# Patient Record
Sex: Female | Born: 1995
Health system: Southern US, Community
[De-identification: ages and names within clinical notes are randomized; demographics above are authoritative.]

## PROBLEM LIST (undated history)

## (undated) DIAGNOSIS — D573 Sickle-cell trait: Secondary | ICD-10-CM

## (undated) HISTORY — PX: ANTERIOR CRUCIATE LIGAMENT REPAIR: SHX115

---
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
## 2014-10-08 ENCOUNTER — Emergency Department (HOSPITAL_BASED_OUTPATIENT_CLINIC_OR_DEPARTMENT_OTHER)
Admission: EM | Admit: 2014-10-08 | Discharge: 2014-10-08 | Disposition: A | Discharge status: Home / Self Care | Payer: BLUE CROSS/BLUE SHIELD | Attending: Emergency Medicine | Admitting: Emergency Medicine

## 2014-10-08 ENCOUNTER — Encounter (HOSPITAL_BASED_OUTPATIENT_CLINIC_OR_DEPARTMENT_OTHER): Payer: Self-pay | Admitting: *Deleted

## 2014-10-08 ENCOUNTER — Emergency Department (HOSPITAL_BASED_OUTPATIENT_CLINIC_OR_DEPARTMENT_OTHER): Payer: BLUE CROSS/BLUE SHIELD

## 2014-10-08 DIAGNOSIS — Y998 Other external cause status: Secondary | ICD-10-CM | POA: Insufficient documentation

## 2014-10-08 DIAGNOSIS — S8991XA Unspecified injury of right lower leg, initial encounter: Secondary | ICD-10-CM | POA: Insufficient documentation

## 2014-10-08 DIAGNOSIS — W010XXA Fall on same level from slipping, tripping and stumbling without subsequent striking against object, initial encounter: Secondary | ICD-10-CM | POA: Diagnosis not present

## 2014-10-08 DIAGNOSIS — Y9289 Other specified places as the place of occurrence of the external cause: Secondary | ICD-10-CM | POA: Insufficient documentation

## 2014-10-08 DIAGNOSIS — Z862 Personal history of diseases of the blood and blood-forming organs and certain disorders involving the immune mechanism: Secondary | ICD-10-CM | POA: Insufficient documentation

## 2014-10-08 DIAGNOSIS — Y9389 Activity, other specified: Secondary | ICD-10-CM | POA: Insufficient documentation

## 2014-10-08 DIAGNOSIS — M25561 Pain in right knee: Secondary | ICD-10-CM

## 2014-10-08 HISTORY — DX: Sickle-cell trait: D57.3

## 2014-10-08 MED ORDER — ACETAMINOPHEN 325 MG PO TABS
650.0000 mg | ORAL_TABLET | Freq: Once | ORAL | Status: AC
  Administered 2014-10-08: 650 mg via ORAL
  Filled 2014-10-08: qty 2

## 2014-10-08 MED ORDER — NAPROXEN 250 MG PO TABS: 250.0000 mg | ORAL_TABLET | Freq: Two times a day (BID) | ORAL | Status: DC

## 2014-10-08 NOTE — ED Notes (Signed)
Pt c/o right knee injury x 4 hrs ago

## 2014-10-08 NOTE — ED Notes (Signed)
Pt returned from xr

## 2014-10-08 NOTE — ED Provider Notes (Signed)
CSN: [id number]     Arrival date & time 10/08/14  1849 History   First MD Initiated Contact with Patient 10/08/14 1912     Chief Complaint  Patient presents with  . Knee Injury   Jacqueline Coffey is 19 y.o. female with a history of a previous right anterior cruciate ligament repair who presents to the emergency department complaining of right anterior knee pain after she tripped and fell while outside today. The patient reports she tripped and fell and injured her knee and an unknown way. She does not believe she fell on her knee. She is complaining of 1 out of 10 anterior knee pain becomes a 7 out of 10 with movement. She reports being able to ambulate without difficulty. Patient is taking nothing for treatment today. She denies numbness, tingling, weakness, ankle pain, foot pain, calf pain or leg swelling.  (Consider location/radiation/quality/duration/timing/severity/associated sxs/prior Treatment) HPI  Past Medical History  Diagnosis Date  . Sickle-cell trait    Past Surgical History  Procedure Laterality Date  . Anterior cruciate ligament repair     History reviewed. No pertinent family history. History  Substance Use Topics  . Smoking status: Never Smoker   . Smokeless tobacco: Not on file  . Alcohol Use: No   OB History    No data available     Review of Systems  Constitutional: Negative for fever.  Musculoskeletal: Positive for arthralgias. Negative for back pain and joint swelling.       Right knee pain   Skin: Negative for rash and wound.  Neurological: Negative for weakness and numbness.      Allergies  Review of patient's allergies indicates no known allergies.  Home Medications   Prior to Admission medications   Medication Sig Start Date End Date Taking? Authorizing Provider  naproxen (NAPROSYN) 250 MG tablet Take 1 tablet (250 mg total) by mouth 2 (two) times daily with a meal. 10/08/14   Everlene FarrierWilliam Clayborne Divis, PA-C   BP 147/88 mmHg  Pulse 92  Temp(Src) 98.6 F  (37 C) (Oral)  Resp 16  Ht 5\' 8"  (1.727 m)  Wt 210 lb (95.255 kg)  BMI 31.94 kg/m2  SpO2 98%  LMP 09/08/2014 Physical Exam  Constitutional: She appears well-developed and well-nourished. No distress.  Nontoxic-appearing.  HENT:  Head: Normocephalic and atraumatic.  Eyes: Right eye exhibits no discharge. Left eye exhibits no discharge.  Cardiovascular: Intact distal pulses.   Bilateral radial, posterior tibialis and dorsalis pedis pulses are intact.    Pulmonary/Chest: Effort normal. No respiratory distress.  Musculoskeletal: Normal range of motion. She exhibits no edema or tenderness.  No right knee tenderness to palpation. No right knee instability. No right knee edema, deformity, abrasions, or ecchymosis. There is a linear scar to her right knee from her previous anterior cruciate ligament repair. The patient is able to ambulate without difficulty or assistance. No right ankle or foot tenderness.  Neurological: She is alert. Coordination normal.  Skin: Skin is warm and dry. No rash noted. She is not diaphoretic. No erythema. No pallor.  Psychiatric: She has a normal mood and affect. Her behavior is normal.  Nursing note and vitals reviewed.   ED Course  Procedures (including critical care time) Labs Review Labs Reviewed - No data to display  Imaging Review Dg Knee Complete 4 Views Right  10/08/2014   CLINICAL DATA:  Post fall today now with diffuse right knee pain. History of ACL repair.  EXAM: RIGHT KNEE - COMPLETE 4+ VIEW  COMPARISON:  Right knee MRI - 08/04/2009 ; right knee radiographs - 08/01/2009  FINDINGS: No fracture or dislocation. Post ACL repair. Joint spaces are preserved. No evidence of chondrocalcinosis. No joint effusion. An involuting NOF is again noted within the distal tibial metaphysis without associated periosteal reaction, grossly unchanged since remote knee MRI and radiographs performed in 2011. Regional soft tissues appear normal.  IMPRESSION: 1. No acute  findings. 2. Sequela of prior ACL repair.   Electronically Signed   By: Simonne Come M.D.   On: 10/08/2014 21:19     EKG Interpretation None      Filed Vitals:   10/08/14 1853  BP: 147/88  Pulse: 92  Temp: 98.6 F (37 C)  TempSrc: Oral  Resp: 16  Height:  (1.727 m)  Weight: 210 lb (95.255 kg)  SpO2: 98%     MDM   Meds given in ED:  Medications  acetaminophen (TYLENOL) tablet 650 mg (650 mg Oral Given 10/08/14 1941)    New Prescriptions   NAPROXEN (NAPROSYN) 250 MG TABLET    Take 1 tablet (250 mg total) by mouth 2 (two) times daily with a meal.    Final diagnoses:  Right knee pain   This is a 19 year old female with a history of a right anterior cruciate ligament repair presents to emergency department complaining of right knee pain after trip and fall earlier today. She is complaining of anterior knee pain. She is unsure of exactly how she injured her right knee. On my exam I am unable to elicit any pain to her knee. Knee x-ray is unremarkable. Will give patient knee sleeve and have her follow-up with her orthopedic surgeon and primary care provider. I advised the patient to follow-up with their primary care provider this week. I advised the patient to return to the emergency department with new or worsening symptoms or new concerns. The patient verbalized understanding and agreement with plan.      Everlene Farrier, PA-C 10/08/14 2136  Geoffery Lyons, MD 10/09/14 1440

## 2014-10-08 NOTE — Discharge Instructions (Signed)
Knee Pain °The knee is the complex joint between your thigh and your lower leg. It is made up of bones, tendons, ligaments, and cartilage. The bones that make up the knee are: °· The femur in the thigh. °· The tibia and fibula in the lower leg. °· The patella or kneecap riding in the groove on the lower femur. °CAUSES  °Knee pain is a common complaint with many causes. A few of these causes are: °· Injury, such as: °¨ A ruptured ligament or tendon injury. °¨ Torn cartilage. °· Medical conditions, such as: °¨ Gout °¨ Arthritis °¨ Infections °· Overuse, over training, or overdoing a physical activity. °Knee pain can be minor or severe. Knee pain can accompany debilitating injury. Minor knee problems often respond well to self-care measures or get well on their own. More serious injuries may need medical intervention or even surgery. °SYMPTOMS °The knee is complex. Symptoms of knee problems can vary widely. Some of the problems are: °· Pain with movement and weight bearing. °· Swelling and tenderness. °· Buckling of the knee. °· Inability to straighten or extend your knee. °· Your knee locks and you cannot straighten it. °· Warmth and redness with pain and fever. °· Deformity or dislocation of the kneecap. °DIAGNOSIS  °Determining what is wrong may be very straight forward such as when there is an injury. It can also be challenging because of the complexity of the knee. Tests to make a diagnosis may include: °· Your caregiver taking a history and doing a physical exam. °· Routine X-rays can be used to rule out other problems. X-rays will not reveal a cartilage tear. Some injuries of the knee can be diagnosed by: °· Arthroscopy a surgical technique by which a small video camera is inserted through tiny incisions on the sides of the knee. This procedure is used to examine and repair internal knee joint problems. Tiny instruments can be used during arthroscopy to repair the torn knee cartilage (meniscus). °· Arthrography  is a radiology technique. A contrast liquid is directly injected into the knee joint. Internal structures of the knee joint then become visible on X-ray film. °· An MRI scan is a non X-ray radiology procedure in which magnetic fields and a computer produce two- or three-dimensional images of the inside of the knee. Cartilage tears are often visible using an MRI scanner. MRI scans have largely replaced arthrography in diagnosing cartilage tears of the knee. °· Blood work. °· Examination of the fluid that helps to lubricate the knee joint (synovial fluid). This is done by taking a sample out using a needle and a syringe. °TREATMENT °The treatment of knee problems depends on the cause. Some of these treatments are: °· Depending on the injury, proper casting, splinting, surgery, or physical therapy care will be needed. °· Give yourself adequate recovery time. Do not overuse your joints. If you begin to get sore during workout routines, back off. Slow down or do fewer repetitions. °· For repetitive activities such as cycling or running, maintain your strength and nutrition. °· Alternate muscle groups. For example, if you are a weight lifter, work the upper body on one day and the lower body the next. °· Either tight or weak muscles do not give the proper support for your knee. Tight or weak muscles do not absorb the stress placed on the knee joint. Keep the muscles surrounding the knee strong. °· Take care of mechanical problems. °· If you have flat feet, orthotics or special shoes may help.   See your caregiver if you need help. °· Arch supports, sometimes with wedges on the inner or outer aspect of the heel, can help. These can shift pressure away from the side of the knee most bothered by osteoarthritis. °· A brace called an "unloader" brace also may be used to help ease the pressure on the most arthritic side of the knee. °· If your caregiver has prescribed crutches, braces, wraps or ice, use as directed. The acronym  for this is PRICE. This means protection, rest, ice, compression, and elevation. °· Nonsteroidal anti-inflammatory drugs (NSAIDs), can help relieve pain. But if taken immediately after an injury, they may actually increase swelling. Take NSAIDs with food in your stomach. Stop them if you develop stomach problems. Do not take these if you have a history of ulcers, stomach pain, or bleeding from the bowel. Do not take without your caregiver's approval if you have problems with fluid retention, heart failure, or kidney problems. °· For ongoing knee problems, physical therapy may be helpful. °· Glucosamine and chondroitin are over-the-counter dietary supplements. Both may help relieve the pain of osteoarthritis in the knee. These medicines are different from the usual anti-inflammatory drugs. Glucosamine may decrease the rate of cartilage destruction. °· Injections of a corticosteroid drug into your knee joint may help reduce the symptoms of an arthritis flare-up. They may provide pain relief that lasts a few months. You may have to wait a few months between injections. The injections do have a small increased risk of infection, water retention, and elevated blood sugar levels. °· Hyaluronic acid injected into damaged joints may ease pain and provide lubrication. These injections may work by reducing inflammation. A series of shots may give relief for as long as 6 months. °· Topical painkillers. Applying certain ointments to your skin may help relieve the pain and stiffness of osteoarthritis. Ask your pharmacist for suggestions. Many over the-counter products are approved for temporary relief of arthritis pain. °· In some countries, doctors often prescribe topical NSAIDs for relief of chronic conditions such as arthritis and tendinitis. A review of treatment with NSAID creams found that they worked as well as oral medications but without the serious side effects. °PREVENTION °· Maintain a healthy weight. Extra pounds  put more strain on your joints. °· Get strong, stay limber. Weak muscles are a common cause of knee injuries. Stretching is important. Include flexibility exercises in your workouts. °· Be smart about exercise. If you have osteoarthritis, chronic knee pain or recurring injuries, you may need to change the way you exercise. This does not mean you have to stop being active. If your knees ache after jogging or playing basketball, consider switching to swimming, water aerobics, or other low-impact activities, at least for a few days a week. Sometimes limiting high-impact activities will provide relief. °· Make sure your shoes fit well. Choose footwear that is right for your sport. °· Protect your knees. Use the proper gear for knee-sensitive activities. Use kneepads when playing volleyball or laying carpet. Buckle your seat belt every time you drive. Most shattered kneecaps occur in car accidents. °· Rest when you are tired. °SEEK MEDICAL CARE IF:  °You have knee pain that is continual and does not seem to be getting better.  °SEEK IMMEDIATE MEDICAL CARE IF:  °Your knee joint feels hot to the touch and you have a high fever. °MAKE SURE YOU:  °· Understand these instructions. °· Will watch your condition. °· Will get help right away if you are not   doing well or get worse. °Document Released: 02/26/2007 Document Revised: 07/24/2011 Document Reviewed: 02/26/2007 °[employer]® Patient Information ©2015 [employer]. This information is not intended to replace advice given to you by your health care provider. Make sure you discuss any questions you have with your health care provider. ° °Knee Bracing °Knee braces are supports to help stabilize and protect an injured or painful knee. They come in many different styles. They should support and protect the knee without increasing the chance of other injuries to yourself or others. It is important not to have a false sense of security when using a brace. Knee braces that help you  to keep using your knee: °· Do not restore normal knee stability under high stress forces. °· May decrease some aspects of athletic performance. °Some of the different types of knee braces are: °· Prophylactic knee braces are designed to prevent or reduce the severity of knee injuries during sports that make injury to the knee more likely. °· Rehabilitative knee braces are designed to allow protected motion of: °¨ Injured knees. °¨ Knees that have been treated with or without surgery. °There is no evidence that the use of a supportive knee brace protects the graft following a successful anterior cruciate ligament (ACL) reconstruction. However, braces are sometimes used to:  °· Protect injured ligaments. °· Control knee movement during the initial healing period. °They may be used as part of the treatment program for the various injured ligaments or cartilage of the knee including the: °· Anterior cruciate ligament. °· Medial collateral ligament. °· Medial or lateral cartilage (meniscus). °· Posterior cruciate ligament. °· Lateral collateral ligament. °Rehabilitative knee braces are most commonly used: °· During crutch-assisted walking right after injury. °· During crutch-assisted walking right after surgery to repair the cartilage and/or cruciate ligament injury. °· For a short period of time, 2-8 weeks, after the injury or surgery. °The value of a rehabilitative brace as opposed to a cast or splint includes the: °· Ability to adjust the brace for swelling. °· Ability to remove the brace for examinations, icing, or showering. °· Ability to allow for movement in a controlled range of motion. °Functional knee braces give support to knees that have already been injured. They are designed to provide stability for the injured knee and provide protection after repair. Functional knee braces may not affect performance much. Lower extremity muscle strengthening, flexibility, and improvement in technique are more important  than bracing in treating ligamentous knee injuries. Functional braces are not a substitute for rehabilitation or surgical procedures. °Unloader/off-loader braces are designed to provide pain relief in arthritic knees. Patients with wear and tear arthritis from growing old or from an old cartilage injury (osteoarthritis) of the knee, and bowlegged (varus) or knock-knee (valgus) deformities, often develop increased pain in the arthritic side due to increased loading. Unloader/off-loader braces are made to reduce uneven loading in such knees. There is reduction in bowing out movement in bowlegged knees when the correct unloader brace is used. Patients with advanced osteoarthritis or severe varus or valgus alignment problems would not likely benefit from bracing. °Patellofemoral braces help the kneecap to move smoothly and well centered over the end of the femur in the knee.  °Most people who wear knee braces feel that they help. However, there is a lack of scientific evidence that knee braces are helpful at the level needed for athletic participation to prevent injury. In spite of this, athletes report an increase in knee stability, pain relief, performance improvement, and confidence   during athletics when using a brace.  °Different knee problems require different knee braces: °· Your caregiver may suggest one kind of knee brace after knee surgery. °· A caregiver may choose another kind of knee brace for support instead of surgery for some types of torn ligaments. °· You may also need one for pain in the front of your knee that is not getting better with strengthening and flexibility exercises. °Get your caregiver's advice if you want to try a knee brace. The caregiver will advise you on where to get them and provide a prescription when it is needed to fashion and/or fit the brace. °Knee braces are the least important part of preventing knee injuries or getting better following injury. Stretching, strengthening and  technique improvement are far more important in caring for and preventing knee injuries. When strengthening your knee, increase your activities a little at a time so as not to develop injuries from overuse. Work out an exercise plan with your caregiver and/or physical therapist to get the best program for you. Do not let a knee brace become a crutch. °Always remember, there are no braces which support the knee as well as your original ligaments and cartilage you were born with. Conditioning, proper warm-up, and stretching remain the most important parts of keeping your knees healthy. °HOW TO USE A KNEE BRACE °· During sports, knee braces should be used as directed by your caregiver. °· Make sure that the hinges are where the knee bends. °· Straps, tapes, or hook-and-loop tapes should be fastened around your leg as instructed. °· You should check the placement of the brace during activities to make sure that it has not moved. Poorly positioned braces can hurt rather than help you. °· To work well, a knee brace should be worn during all activities that put you at risk of knee injury. °· Warm up properly before beginning athletic activities. °HOME CARE INSTRUCTIONS °· Knee braces often get damaged during normal use. Replace worn-out braces for maximum benefit. °· Clean regularly with soap and water. °· Inspect your brace often for wear and tear. °· Cover exposed metal to protect others from injury. °· Durable materials may cost more, but last longer. °SEEK IMMEDIATE MEDICAL CARE IF:  °· Your knee seems to be getting worse rather than better. °· You have increasing pain or swelling in the knee. °· You have problems caused by the knee brace. °· You have increased swelling or inflammation (redness or soreness) in your knee. °· Your knee becomes warm and more painful and you develop an unexplained temperature over 101°F (38.3°C). °MAKE SURE YOU:  °· Understand these instructions. °· Will watch your condition. °· Will get  help right away if you are not doing well or get worse. °See your caregiver, physical therapist, or orthopedic surgeon for additional information. °Document Released: 07/22/2003 Document Revised: 09/15/2013 Document Reviewed: 10/28/2008 °ExitCare® Patient Information ©2015 [employer]. This information is not intended to replace advice given to you by your health care provider. Make sure you discuss any questions you have with your health care provider. ° °

## 2014-10-08 NOTE — ED Notes (Signed)
Patient transported to X-ray 

## 2015-11-21 IMAGING — CR DG KNEE COMPLETE 4+V*R*
4 series · 4 of 4 positions shown · non-contrast
Comparison: Right knee MRI - 08/04/2009 ; right knee radiographs -
08/01/2009

CLINICAL DATA: Post fall today now with diffuse right knee pain.
History of ACL repair.

EXAM:
RIGHT KNEE - COMPLETE 4+ VIEW

[t knee ap right]
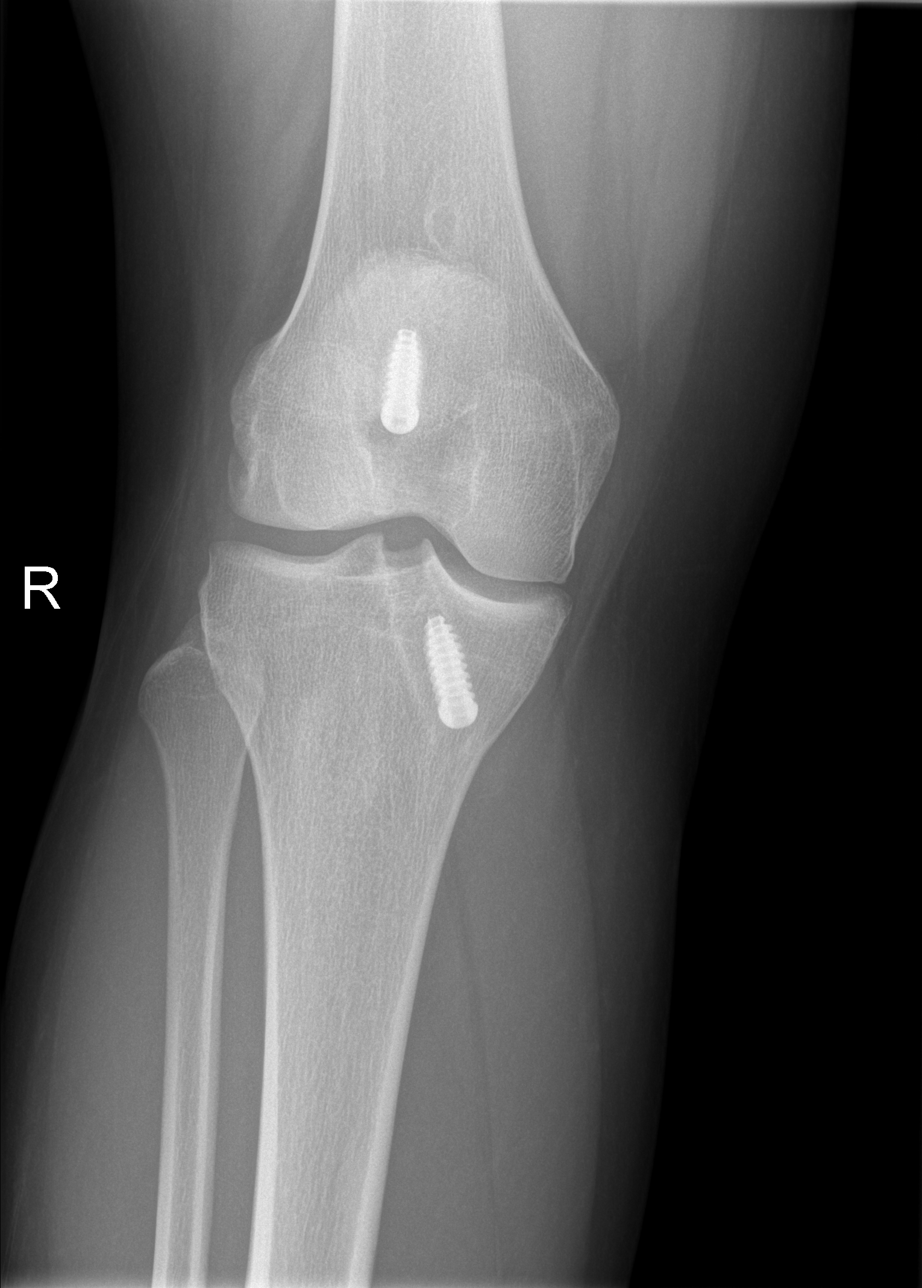

[t knee oblique right (1 of 2)]
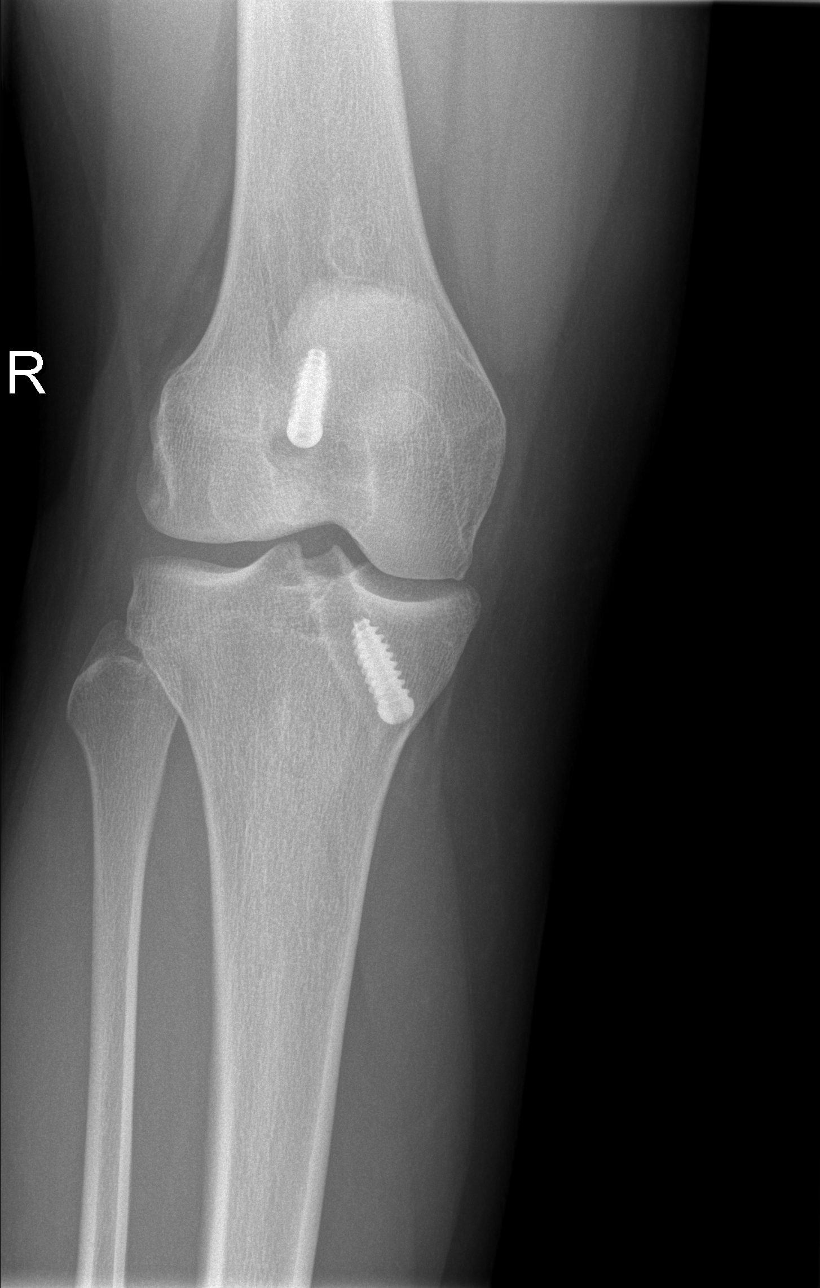

[t knee oblique right (2 of 2)]
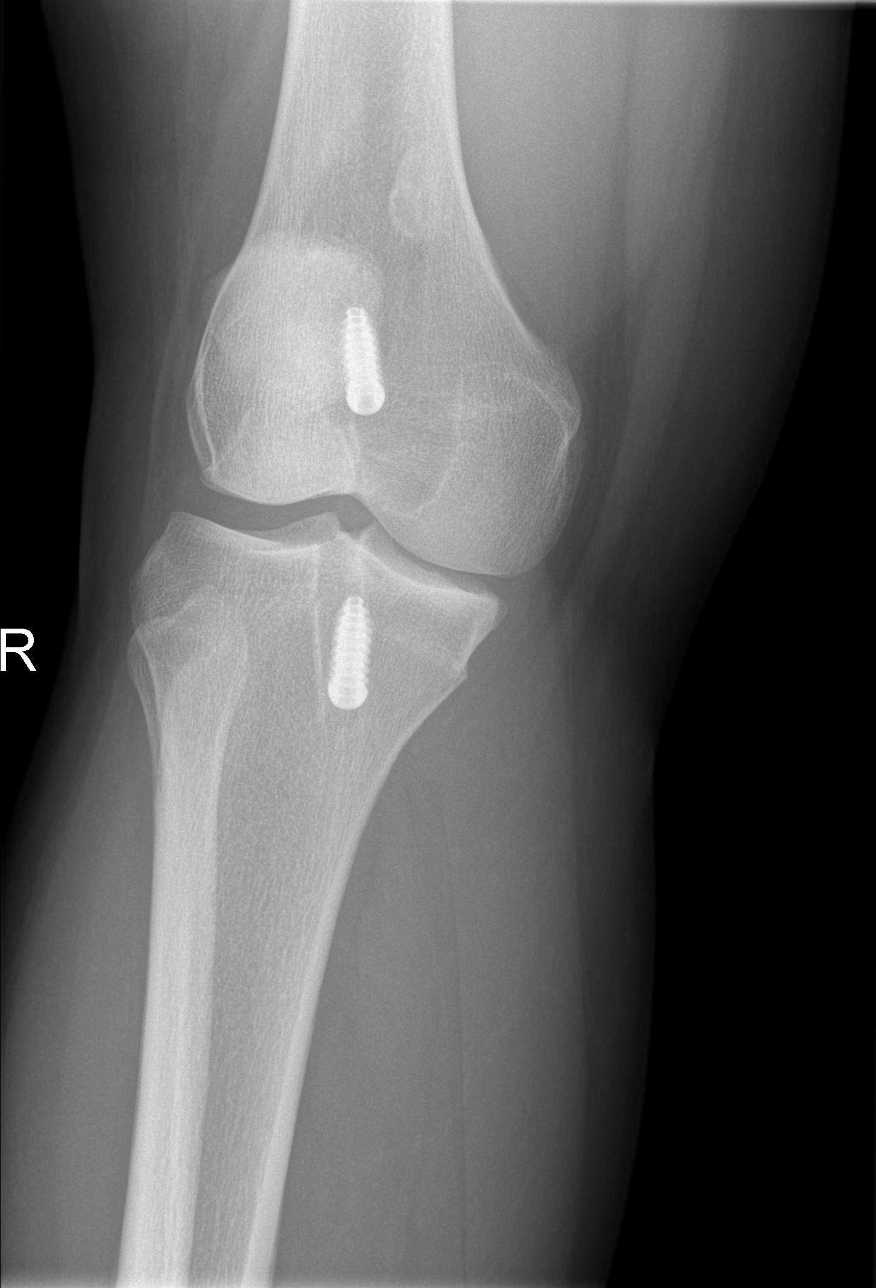

[t knee lat right]
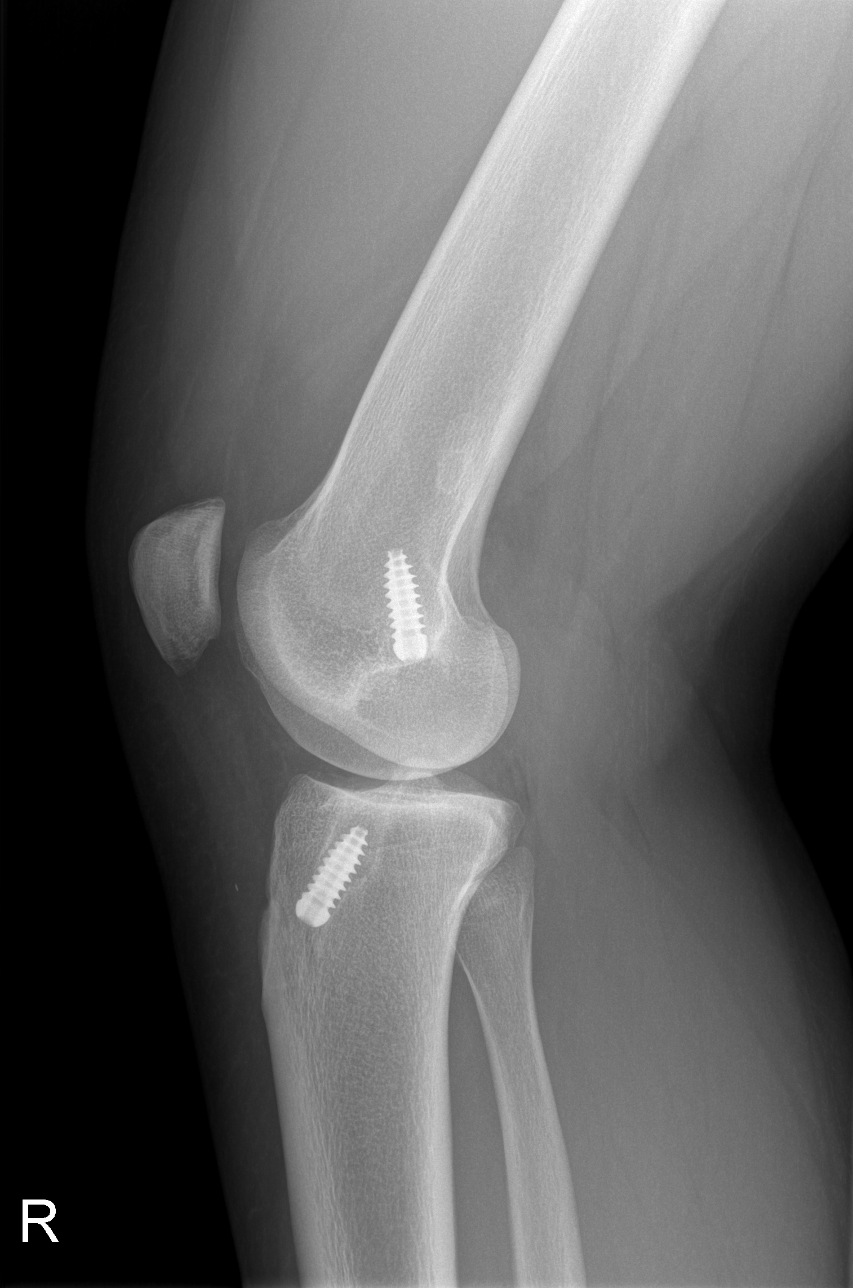

[4 of 4 positions shown; findings below may reference images not displayed]

FINDINGS: No fracture or dislocation. Post ACL repair. Joint spaces are
preserved. No evidence of chondrocalcinosis. No joint effusion. An
involuting NOF is again noted within the distal tibial metaphysis
without associated periosteal reaction, grossly unchanged since
remote knee MRI and radiographs performed in 5600. Regional soft
tissues appear normal.
IMPRESSION: 1. No acute findings.
2. Sequela of prior ACL repair.

## 2019-12-01 ENCOUNTER — Other Ambulatory Visit: Payer: Self-pay

## 2019-12-01 ENCOUNTER — Emergency Department (HOSPITAL_BASED_OUTPATIENT_CLINIC_OR_DEPARTMENT_OTHER)
Admission: EM | Admit: 2019-12-01 | Discharge: 2019-12-01 | Disposition: A | Discharge status: Home / Self Care | Payer: BC Managed Care – PPO | Attending: Emergency Medicine | Admitting: Emergency Medicine

## 2019-12-01 ENCOUNTER — Encounter (HOSPITAL_BASED_OUTPATIENT_CLINIC_OR_DEPARTMENT_OTHER): Payer: Self-pay | Admitting: *Deleted

## 2019-12-01 DIAGNOSIS — R109 Unspecified abdominal pain: Secondary | ICD-10-CM | POA: Diagnosis present

## 2019-12-01 DIAGNOSIS — N12 Tubulo-interstitial nephritis, not specified as acute or chronic: Secondary | ICD-10-CM | POA: Diagnosis not present

## 2019-12-01 DIAGNOSIS — Z20822 Contact with and (suspected) exposure to covid-19: Secondary | ICD-10-CM | POA: Diagnosis not present

## 2019-12-01 DIAGNOSIS — M549 Dorsalgia, unspecified: Secondary | ICD-10-CM | POA: Diagnosis not present

## 2019-12-01 DIAGNOSIS — R6883 Chills (without fever): Secondary | ICD-10-CM | POA: Insufficient documentation

## 2019-12-01 LAB — URINALYSIS, MICROSCOPIC (REFLEX)

## 2019-12-01 LAB — URINALYSIS, ROUTINE W REFLEX MICROSCOPIC
Bilirubin Urine: NEGATIVE
Glucose, UA: NEGATIVE mg/dL
Ketones, ur: NEGATIVE mg/dL
Nitrite: NEGATIVE
Protein, ur: NEGATIVE mg/dL
Specific Gravity, Urine: 1.01 (ref 1.005–1.030)
pH: 6 (ref 5.0–8.0)

## 2019-12-01 LAB — PREGNANCY, URINE: Preg Test, Ur: NEGATIVE

## 2019-12-01 LAB — SARS CORONAVIRUS 2 BY RT PCR (HOSPITAL ORDER, PERFORMED IN ~~LOC~~ HOSPITAL LAB): SARS Coronavirus 2: NEGATIVE

## 2019-12-01 MED ORDER — SULFAMETHOXAZOLE-TRIMETHOPRIM 800-160 MG PO TABS: 1.0000 | ORAL_TABLET | Freq: Two times a day (BID) | ORAL | 0 refills | Status: AC

## 2019-12-01 MED ORDER — IBUPROFEN 400 MG PO TABS
600.0000 mg | ORAL_TABLET | Freq: Once | ORAL | Status: AC
  Administered 2019-12-01: 600 mg via ORAL
  Filled 2019-12-01: qty 1

## 2019-12-01 MED ORDER — SULFAMETHOXAZOLE-TRIMETHOPRIM 800-160 MG PO TABS
1.0000 | ORAL_TABLET | Freq: Once | ORAL | Status: AC
  Administered 2019-12-01: 1 via ORAL
  Filled 2019-12-01: qty 1

## 2019-12-01 MED FILL — SULFAMETHOXAZOLE-TMP DS TAB: 800-160 | 7 days supply | Qty: 14 | Fill #0

## 2019-12-01 NOTE — Discharge Instructions (Signed)
It is important that you stay hydrated. Take the antibiotic, bactrim, as directed until gone. You can take ibuprofen every 6 hours for pain. Follow-up with your primary care provider within 3 to 5 days. Return to the emergency department for high fever, uncontrollable vomiting, or severely worsening symptoms.

## 2019-12-01 NOTE — ED Provider Notes (Signed)
[facility] EMERGENCY DEPARTMENT Provider Note   CSN: [id number] Arrival date & time: 12/01/19  0845     History Chief Complaint  Patient presents with  . Flank Pain    Jacqueline Coffey is a 24 y.o. female w PMHx sickle cell trait, presenting to the ED with complaint of left sided abdominal/flank pain that has been coming and going over the last 2 weeks.  She states pain is sharp and stabbing in nature.  She has associated intermittent dysuria with cloudy urine and urinary urgency.  She states yesterday she felt chills while she was outside in the heat, therefore went home and went to bed.  She did not document a fever.  She denies history of pyelonephritis or nephrolithiasis.  Denies nausea, vomiting, diarrhea or constipation, no history of abdominal surgeries.  She did not treat her symptoms with any medications.  LMP was mid June, it is normally irregular.  Denies new pelvic complaints.  The history is provided by the patient.       Past Medical History:  Diagnosis Date  . Sickle-cell trait (HCC)     There are no problems to display for this patient.   Past Surgical History:  Procedure Laterality Date  . ANTERIOR CRUCIATE LIGAMENT REPAIR       OB History   No obstetric history on file.     No family history on file.  Social History   Tobacco Use  . Smoking status: Never Smoker  Substance Use Topics  . Alcohol use: No  . Drug use: No    Home Medications Prior to Admission medications   Medication Sig Start Date End Date Taking? Authorizing Provider  sulfamethoxazole-trimethoprim (BACTRIM DS) 800-160 MG tablet Take 1 tablet by mouth 2 (two) times daily for 7 days. 12/01/19 12/08/19  Miyako Oelke, [place], PA-C    Allergies    Patient has no known allergies.  Review of Systems   Review of Systems  Constitutional: Positive for chills. Negative for fever.  Gastrointestinal: Positive for abdominal pain. Negative for nausea and vomiting.  Genitourinary:  Positive for dysuria, flank pain and urgency. Negative for pelvic pain and vaginal discharge.  Musculoskeletal: Positive for back pain.  All other systems reviewed and are negative.   Physical Exam Updated Vital Signs BP 122/70 (BP Location: Right Arm)   Pulse 88   Temp 99.9 F (37.7 C) (Oral)   Resp 18   Ht 5\' 7"  (1.702 m)   Wt 96.2 kg   LMP 10/28/2019 (Approximate)   SpO2 97%   BMI 33.20 kg/m   Physical Exam Vitals and nursing note reviewed.  Constitutional:      General: She is not in acute distress.    Appearance: She is well-developed. She is not ill-appearing.  HENT:     Head: Normocephalic and atraumatic.  Eyes:     Conjunctiva/sclera: Conjunctivae normal.  Cardiovascular:     Rate and Rhythm: Normal rate and regular rhythm.  Pulmonary:     Effort: Pulmonary effort is normal. No respiratory distress.     Breath sounds: Normal breath sounds.  Abdominal:     General: Bowel sounds are normal.     Palpations: Abdomen is soft.     Tenderness: There is abdominal tenderness. There is left CVA tenderness. There is no guarding or rebound.     Comments: No skin changes  Skin:    General: Skin is warm.  Neurological:     Mental Status: She is alert.  Psychiatric:  Behavior: Behavior normal.     ED Results / Procedures / Treatments   Labs (all labs ordered are listed, but only abnormal results are displayed) Labs Reviewed  URINALYSIS, ROUTINE W REFLEX MICROSCOPIC - Abnormal; Notable for the following components:      Result Value   APPearance CLOUDY (*)    Hgb urine dipstick TRACE (*)    Leukocytes,Ua LARGE (*)    All other components within normal limits  URINALYSIS, MICROSCOPIC (REFLEX) - Abnormal; Notable for the following components:   Bacteria, UA MANY (*)    All other components within normal limits  URINE CULTURE  SARS CORONAVIRUS 2 BY RT PCR [facility] ORDER, PERFORMED IN [facility])  PREGNANCY, URINE    EKG EKG  Interpretation  Date/Time:  Monday December 01 2019 09:08:23 EDT Ventricular Rate:  76 PR Interval:    QRS Duration: 93 QT Interval:  362 QTC Calculation: 407 R Axis:   77 Text Interpretation: Sinus rhythm No old tracing to compare Confirmed by Rolan Bucco [phone]) on 12/01/2019 9:12:43 AM   Radiology No results found.  Procedures Procedures (including critical care time)  Medications Ordered in ED Medications  sulfamethoxazole-trimethoprim (BACTRIM DS) 800-160 MG per tablet 1 tablet (has no administration in time range)  ibuprofen (ADVIL) tablet 600 mg (has no administration in time range)    ED Course  I have reviewed the triage vital signs and the nursing notes.  Pertinent labs & imaging results that were available during my care of the patient were reviewed by me and considered in my medical decision making (see chart for details).    MDM Rules/Calculators/A&P                          Patient presenting with 2 weeks of intermittent left-sided abdominal/flank pain with associated urinary symptoms.  She reported chills yesterday, however no fever.  Denies nausea, vomiting, diarrhea, constipation or pelvic complaints.  On exam, she is well-appearing and in no distress.  She is afebrile stable vital signs.  There is some tenderness to the left mid/upper abdomen radiating around to the left flank.  There is also tenderness to the left CVA/left lower back.  No skin changes.  Differential diagnosis include pyelonephritis versus nephrolithiasis versus enteritis versus lower UTI.  Will obtain urine check pregnancy test.  U/A is consistent with infection. Urine culture sent. VSS, pt remains well appearing and in no distress. Will treat with abx, and supportive measures. Encouraged outpt follow up and discussed strict return precautions. Pt is appropriate for discharge.  Pt requesting covid test as she traveled out of state a few days ago - asymptomatic.  Discussed results, findings,  treatment and follow up. Patient advised of return precautions. Patient verbalized understanding and agreed with plan.  Final Clinical Impression(s) / ED Diagnoses Final diagnoses:  Pyelonephritis    Rx / DC Orders ED Discharge Orders         Ordered    sulfamethoxazole-trimethoprim (BACTRIM DS) 800-160 MG tablet  2 times daily     Discontinue  Reprint     12/01/19 1014           Abigial Newville, [place], [place] 12/01/19 1017    Rolan Bucco, MD 12/01/19 1327

## 2019-12-01 NOTE — ED Triage Notes (Signed)
C/o left upper abd/flank pain radiating to back, chills and ha onset 2 weeks ago  Burgess Estelle was worse  Denies n/v/d

## 2019-12-03 LAB — URINE CULTURE: Culture: >=100000 — AB

## 2019-12-04 ENCOUNTER — Telehealth: Payer: Self-pay | Admitting: *Deleted

## 2019-12-04 NOTE — Telephone Encounter (Signed)
Post ED Visit - Positive Culture Follow-up  Culture report reviewed by antimicrobial stewardship pharmacist: [facility] []  , Pharm.D. []  Enzo Bi, Pharm.D., BCPS AQ-ID []  , Pharm.D., BCPS []  Celedonio Miyamoto, Pharm.D., BCPS []  [place], Garvin Fila.D., BCPS, AAHIVP []  , Pharm.D., BCPS, AAHIVP []  Georgina Pillion, PharmD, BCPS []  , PharmD, BCPS []  [place], PharmD, BCPS []  [address], PharmD []  , PharmD, BCPS []  Estella Husk, PharmD  Pharmacy Team []  Lysle Pearl, PharmD []  , PharmD []  Phillips Climes, PharmD []  , Rph []  Agapito Games) , PharmD []  Verlan Friends, PharmD []  , PharmD []  Mervyn Gay, PharmD []  , PharmD []  Vinnie Level, PharmD []  [facility], PharmD []  , PharmD []  Len Childs, PharmD   Positive urine culture Treated with Sulfamethoxazole-Trimethoprim, organism sensitive to the same and no further patient follow-up is required at this time.  [facility] 12/04/2019, 3:31 PM
